# Patient Record
Sex: Male | Born: 1970 | Race: White | Hispanic: No | Marital: Married
Health system: Southern US, Community
[De-identification: ages and names within clinical notes are randomized; demographics above are authoritative.]

---

## 2021-03-26 ENCOUNTER — Ambulatory Visit (INDEPENDENT_AMBULATORY_CARE_PROVIDER_SITE_OTHER): Payer: Managed Care, Other (non HMO)

## 2021-03-26 ENCOUNTER — Other Ambulatory Visit: Payer: Self-pay

## 2021-03-26 ENCOUNTER — Ambulatory Visit (INDEPENDENT_AMBULATORY_CARE_PROVIDER_SITE_OTHER): Payer: Managed Care, Other (non HMO) | Admitting: Family Medicine

## 2021-03-26 VITALS — BP 142/90 | HR 73 | Ht 69.0 in | Wt 239.0 lb

## 2021-03-26 DIAGNOSIS — M9905 Segmental and somatic dysfunction of pelvic region: Secondary | ICD-10-CM | POA: Diagnosis not present

## 2021-03-26 DIAGNOSIS — M9902 Segmental and somatic dysfunction of thoracic region: Secondary | ICD-10-CM

## 2021-03-26 DIAGNOSIS — M25552 Pain in left hip: Secondary | ICD-10-CM

## 2021-03-26 DIAGNOSIS — M9901 Segmental and somatic dysfunction of cervical region: Secondary | ICD-10-CM | POA: Diagnosis not present

## 2021-03-26 DIAGNOSIS — M9903 Segmental and somatic dysfunction of lumbar region: Secondary | ICD-10-CM | POA: Diagnosis not present

## 2021-03-26 DIAGNOSIS — M25551 Pain in right hip: Secondary | ICD-10-CM

## 2021-03-26 DIAGNOSIS — M542 Cervicalgia: Secondary | ICD-10-CM

## 2021-03-26 DIAGNOSIS — M545 Low back pain, unspecified: Secondary | ICD-10-CM

## 2021-03-26 DIAGNOSIS — M9904 Segmental and somatic dysfunction of sacral region: Secondary | ICD-10-CM

## 2021-03-26 DIAGNOSIS — M533 Sacrococcygeal disorders, not elsewhere classified: Secondary | ICD-10-CM | POA: Diagnosis not present

## 2021-03-26 MED ORDER — GABAPENTIN 100 MG PO CAPS: 200.0000 mg | ORAL_CAPSULE | Freq: Every day | ORAL | 0 refills | Status: DC

## 2021-03-26 NOTE — Assessment & Plan Note (Signed)
Seems like patient does have some poor core strength that likely is contributing.  I do believe the patient's right side of the sacrum does seem to have significant swelling noted as well.  Discussed icing regimen and home exercises, which activities to do which wants to avoid.  Increase activity slowly.  Follow-up again in 6 to 8 weeks.

## 2021-03-26 NOTE — Assessment & Plan Note (Signed)
   Decision today to treat with OMT was based on Physical Exam  After verbal consent patient was treated with HVLA, ME, FPR techniques in cervical, thoracic, pelvis, lumbar and sacral areas, all areas are chronic   Patient tolerated the procedure well with improvement in symptoms  Patient given exercises, stretches and lifestyle modifications  See medications in patient instructions if given  Patient will follow up in 4-8 weeks 

## 2021-03-26 NOTE — Patient Instructions (Addendum)
Gabapentin 200mg  at night Ice SI joint exercises Xray today See me again in 4-6 weeks

## 2021-03-26 NOTE — Progress Notes (Signed)
[facility] [address] [place] [address] Phone: [phone] Subjective:   Bruce Donath, am serving as a scribe for Dr. Antoine Primas.  This visit occurred during the SARS-CoV-2 public health emergency.  Safety protocols were in place, including screening questions prior to the visit, additional usage of staff PPE, and extensive cleaning of exam room while observing appropriate contact time as indicated for disinfecting solutions.  I'm seeing this patient by the request  of:  Gordnier, [place], [place]  CC: Neck and back pain follow-up  [id number]  Arthur [name] is a 51 y.o. male coming in with complaint of chronic back and B hip pain. Pain in lower back and radiates into greater trochanter. Denies any radiating symptoms unless he is stretching the muscle. Pain worse in less supportive bed and standing up which will cause a burning. Patient has tried ice, heat, stretching, and chiropractic care. Uses IBU prn.      Social History   Socioeconomic History   Marital status: Married    Spouse name: Not on file   Number of children: Not on file   Years of education: Not on file   Highest education level: Not on file  Occupational History   Not on file  Tobacco Use   Smoking status: Not on file   Smokeless tobacco: Not on file  Substance and Sexual Activity   Alcohol use: Not on file   Drug use: Not on file   Sexual activity: Not on file  Other Topics Concern   Not on file  Social History Narrative   Not on file   Social Determinants of Health   Financial Resource Strain: Not on file  Food Insecurity: Not on file  Transportation Needs: Not on file  Physical Activity: Not on file  Stress: Not on file  Social Connections: Not on file   Not on File No family history on file.   Current Outpatient Medications (Cardiovascular):    amlodipine-atorvastatin (CADUET) 10-10 MG tablet, Take 1 tablet by mouth daily.   hydrochlorothiazide  (MICROZIDE) 12.5 MG capsule, Take 12.5 mg by mouth daily.     Current Outpatient Medications (Other):    gabapentin (NEURONTIN) 100 MG capsule, Take 2 capsules (200 mg total) by mouth at bedtime.   Reviewed prior external information including notes and imaging from  primary care provider As well as notes that were available from care everywhere and other healthcare systems.  Past medical history, social, surgical and family history all reviewed in electronic medical record.  No pertanent information unless stated regarding to the chief complaint.   Review of Systems:  No headache, visual changes, nausea, vomiting, diarrhea, constipation, dizziness, abdominal pain, skin rash, fevers, chills, night sweats, weight loss, swollen lymph nodes, body aches, joint swelling, chest pain, shortness of breath, mood changes. POSITIVE muscle aches  Objective  Blood pressure (!) 142/90, pulse 73, height 5\' 9"  (1.753 m), weight 239 lb (108.4 kg), SpO2 96 %.   General: No apparent distress alert and oriented x3 mood and affect normal, dressed appropriately.  HEENT: Pupils equal, extraocular movements intact  Respiratory: Patient's speak in full sentences and does not appear short of breath  Cardiovascular: No lower extremity edema, non tender, no erythema  Gait normal with good balance and coordination.  MSK:  Non tender with full range of motion and good stability and symmetric strength and tone of shoulders, elbows, wrist, hip, knee and ankles bilaterally.  Low back exam does have some  loss of lordosis.  Some tenderness to palpation of the paraspinal musculature.  Significant tightness of the right side of the hip.  Patient also has what appears to be a pelvic shear.  Neck exam also has significant loss of lordosis.  Does have difficulty with sidebending.  Osteopathic findings C2 flexed rotated and side bent right T9 extended rotated and side bent left L2 flexed rotated and side bent right Sacrum  right on right Pelvic shear right   Impression and Recommendations:    The above documentation has been reviewed and is accurate and complete Judi Saa, DO

## 2021-04-23 NOTE — Progress Notes (Signed)
?Arthur [name] D.O. ?[facility] ?Arthur [name] ?Phone: [phone] ?Subjective:   ?I, Arthur [name], am serving as a scribe for Dr. Hulan Saas. ? ?This visit occurred during the SARS-CoV-2 public health emergency.  Safety protocols were in place, including screening questions prior to the visit, additional usage of staff PPE, and extensive cleaning of exam room while observing appropriate contact time as indicated for disinfecting solutions.  ? ? ?I'm seeing this patient by the request  of:  Arthur [name], [place] ? ?CC: Low back pain follow-up ? ?[id number]  ?Arthur [name] is a 51 y.o. male coming in with complaint of back and neck pain. OMT on 03/26/2021.  Patient was seen and started on gabapentin last exam given home exercises.  Patient states that lumbar spine and L hip are bothering him as he missed his chiropractic appt last week. Pain is radiating down lateral aspect of thighs. Pain is intermittent.  ? 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 ?X-rays of the hips also showed some mild degenerative changes bilaterally. ? 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 ? ? ?Medications patient has been prescribed:  ? ?Taking: ? ? ?  ? ? ? ? ?Reviewed prior external information including notes and imaging from previsou exam, outside providers and external EMR if available.  ? ?As well as notes that were available from care everywhere and other healthcare systems. ? ?Past medical history, social, surgical and family history all reviewed in electronic medical record.  No pertanent information unless stated regarding to the chief complaint.  ? ?No past medical history on file.  ?Not on File ? ? ?Review of Systems: ? No headache, visual changes, nausea, vomiting, diarrhea, constipation, dizziness, abdominal  pain, skin rash, fevers, chills, night sweats, weight loss, swollen lymph nodes, body aches, joint swelling, chest pain, shortness of breath, mood changes. POSITIVE muscle aches ? ?Objective  ?Blood pressure 110/78, pulse 78, height 5\' 9"  (1.753 m), weight 239 lb (108.4 kg), SpO2 98 %. ?  ?General: No apparent distress alert and oriented x3 mood and affect normal, dressed appropriately.  ?HEENT: Pupils equal, extraocular movements intact  ?Respiratory: Patient's speak in full sentences and does not appear short of breath  ?Cardiovascular: No lower extremity edema, non tender, no erythema  ?Gait normal with good balance and coordination.  ?Back - lower back does have loss of lordosis, does have tightness noted with FABER test bilaterally.  Patient still has significant tightness with straight leg test but no true radicular symptoms.  Patient does have some tightness noted overall. ? ?Osteopathic findings ? ?C6 flexed rotated and side bent left ?T7 extended rotated and side bent right  ?L1 flexed rotated and side bent right ?L4 flexed rotated and side bent left ?Sacrum right on right ? ? ? ? ?  ?Assessment and Plan: ? ? ?SI (sacroiliac) joint dysfunction ?Chronic problem, with mild exacerbation.  Patient continues to have some discomfort and pain.  Patient does have some arthritic changes that do seem to be a little bit out of proportion to patient's age at this point and I do feel that further laboratory work-up would be beneficial.  Patient will have this done and we will see how patient responds.  Discussed icing regimen and home exercises.  Increase activity slowly. F/u 6- 8 weeks ?  ?  Nonallopathic problems ? ?Decision today to treat with OMT was based on Physical Exam ? ?After verbal consent patient was treated with HVLA, ME, FPR techniques in cervical,  thoracic, lumbar, and sacral  areas ? ?Patient tolerated the procedure well with improvement in symptoms ? ?Patient given exercises, stretches and lifestyle  modifications ? ?See medications in patient instructions if given ? ?Patient will follow up in 4-8 weeks ? ?  ? ? ?The above documentation has been reviewed and is accurate and complete Arthur Pulley, DO ? ? ? ?  ? ? Note: This dictation was prepared with Dragon dictation along with smaller phrase technology. Any transcriptional errors that result from this process are unintentional.    ?  ?  ? ?

## 2021-04-24 ENCOUNTER — Encounter: Payer: Self-pay | Admitting: Family Medicine

## 2021-04-24 ENCOUNTER — Ambulatory Visit (INDEPENDENT_AMBULATORY_CARE_PROVIDER_SITE_OTHER): Payer: Managed Care, Other (non HMO) | Admitting: Family Medicine

## 2021-04-24 ENCOUNTER — Other Ambulatory Visit: Payer: Self-pay

## 2021-04-24 VITALS — BP 110/78 | HR 78 | Ht 69.0 in | Wt 239.0 lb

## 2021-04-24 DIAGNOSIS — M9904 Segmental and somatic dysfunction of sacral region: Secondary | ICD-10-CM | POA: Diagnosis not present

## 2021-04-24 DIAGNOSIS — M2550 Pain in unspecified joint: Secondary | ICD-10-CM | POA: Diagnosis not present

## 2021-04-24 DIAGNOSIS — M9902 Segmental and somatic dysfunction of thoracic region: Secondary | ICD-10-CM | POA: Diagnosis not present

## 2021-04-24 DIAGNOSIS — M9903 Segmental and somatic dysfunction of lumbar region: Secondary | ICD-10-CM | POA: Diagnosis not present

## 2021-04-24 DIAGNOSIS — M533 Sacrococcygeal disorders, not elsewhere classified: Secondary | ICD-10-CM

## 2021-04-24 LAB — COMPREHENSIVE METABOLIC PANEL
ALT: 24 U/L (ref 0–53)
AST: 18 U/L (ref 0–37)
Albumin: 4.6 g/dL (ref 3.5–5.2)
Alkaline Phosphatase: 66 U/L (ref 39–117)
BUN: 14 mg/dL (ref 6–23)
CO2: 30 mEq/L (ref 19–32)
Calcium: 9.4 mg/dL (ref 8.4–10.5)
Chloride: 101 mEq/L (ref 96–112)
Creatinine, Ser: 1.09 mg/dL (ref 0.40–1.50)
GFR: 79.85 mL/min (ref >60.00)
Glucose, Bld: 84 mg/dL (ref 70–99)
Potassium: 3.9 mEq/L (ref 3.5–5.1)
Sodium: 139 mEq/L (ref 135–145)
Total Bilirubin: 1.1 mg/dL (ref 0.2–1.2)
Total Protein: 7 g/dL (ref 6.0–8.3)

## 2021-04-24 LAB — URIC ACID: Uric Acid, Serum: 7.2 mg/dL (ref 4.0–7.8)

## 2021-04-24 LAB — CBC WITH DIFFERENTIAL/PLATELET
Basophils Absolute: 0 10*3/uL (ref 0.0–0.1)
Basophils Relative: 0.5 % (ref 0.0–3.0)
Eosinophils Absolute: 0.3 10*3/uL (ref 0.0–0.7)
Eosinophils Relative: 3.2 % (ref 0.0–5.0)
HCT: 44.5 % (ref 39.0–52.0)
Hemoglobin: 15.5 g/dL (ref 13.0–17.0)
Lymphocytes Relative: 27.3 % (ref 12.0–46.0)
Lymphs Abs: 2.5 10*3/uL (ref 0.7–4.0)
MCHC: 34.9 g/dL (ref 30.0–36.0)
MCV: 85.6 fl (ref 78.0–100.0)
Monocytes Absolute: 0.9 10*3/uL (ref 0.1–1.0)
Monocytes Relative: 9.7 % (ref 3.0–12.0)
Neutro Abs: 5.5 10*3/uL (ref 1.4–7.7)
Neutrophils Relative %: 59.3 % (ref 43.0–77.0)
Platelets: 376 10*3/uL (ref 150.0–400.0)
RBC: 5.2 Mil/uL (ref 4.22–5.81)
RDW: 13.5 % (ref 11.5–15.5)
WBC: 9.2 10*3/uL (ref 4.0–10.5)

## 2021-04-24 LAB — FERRITIN: Ferritin: 182 ng/mL (ref 22.0–322.0)

## 2021-04-24 LAB — VITAMIN D 25 HYDROXY (VIT D DEFICIENCY, FRACTURES): VITD: 22.31 ng/mL — ABNORMAL LOW (ref 30.00–100.00)

## 2021-04-24 LAB — IBC PANEL
Iron: 128 ug/dL (ref 42–165)
Saturation Ratios: 37.6 % (ref 20.0–50.0)
TIBC: 340.2 ug/dL (ref 250.0–450.0)
Transferrin: 243 mg/dL (ref 212.0–360.0)

## 2021-04-24 LAB — C-REACTIVE PROTEIN: CRP: <1 mg/dL (ref 0.5–20.0)

## 2021-04-24 LAB — VITAMIN B12: Vitamin B-12: 418 pg/mL (ref 211–911)

## 2021-04-24 LAB — TSH: TSH: 2.53 u[IU]/mL (ref 0.35–5.50)

## 2021-04-24 LAB — SEDIMENTATION RATE: Sed Rate: 3 mm/hr (ref 0–15)

## 2021-04-24 LAB — TESTOSTERONE: Testosterone: 211.84 ng/dL — ABNORMAL LOW (ref 300.00–890.00)

## 2021-04-24 NOTE — Assessment & Plan Note (Signed)
Chronic problem, with mild exacerbation.  Patient continues to have some discomfort and pain.  Patient does have some arthritic changes that do seem to be a little bit out of proportion to patient's age at this point and I do feel that further laboratory work-up would be beneficial.  Patient will have this done and we will see how patient responds.  Discussed icing regimen and home exercises.  Increase activity slowly. F/u 6- 8 weeks ?

## 2021-04-24 NOTE — Patient Instructions (Signed)
Labs today See me in 5-6 weeks  

## 2021-04-25 ENCOUNTER — Other Ambulatory Visit: Payer: Self-pay

## 2021-04-25 MED ORDER — VITAMIN D (ERGOCALCIFEROL) 1.25 MG (50000 UNIT) PO CAPS: 50000.0000 [IU] | ORAL_CAPSULE | ORAL | 0 refills | Status: DC

## 2021-04-27 LAB — ANGIOTENSIN CONVERTING ENZYME: Angiotensin-Converting Enzyme: 9 U/L (ref 9–67)

## 2021-04-27 LAB — ANA: Anti Nuclear Antibody (ANA): NEGATIVE

## 2021-04-27 LAB — PTH, INTACT AND CALCIUM
Calcium: 9.4 mg/dL (ref 8.6–10.3)
PTH: 46 pg/mL (ref 16–77)

## 2021-04-27 LAB — CALCIUM, IONIZED: Calcium, Ion: 5.1 mg/dL (ref 4.7–5.5)

## 2021-04-27 LAB — CYCLIC CITRUL PEPTIDE ANTIBODY, IGG: Cyclic Citrullin Peptide Ab: <16 UNITS

## 2021-04-27 LAB — RHEUMATOID FACTOR: Rheumatoid fact SerPl-aCnc: <14 IU/mL (ref <14)

## 2021-05-28 NOTE — Progress Notes (Signed)
?Terrilee Files D.O. ?[facility] ?[address] [place] [address] ?Phone: [phone] ?Subjective:   ?I, Nadine Counts, am serving as a Neurosurgeon for Dr. Antoine Primas. ?This visit occurred during the SARS-CoV-2 public health emergency.  Safety protocols were in place, including screening questions prior to the visit, additional usage of staff PPE, and extensive cleaning of exam room while observing appropriate contact time as indicated for disinfecting solutions.  ? ?I'm seeing this patient by the request  of:  Daylene Katayama, [place] ? ?CC: back and neck pain  ? ?[id number]  ?Arthur [name] is a 51 y.o. male coming in with complaint of back and neck pain. OMT on 04/24/2021. Patient states doing a little bit better. Right arm believes he tore something in 2019. Feels weak carrying loads. No other complaint. ? ?Medications patient has been prescribed: Gabapentin Vit D ? ?Taking: ? ? ?  ? ? ? ? ?Reviewed prior external information including notes and imaging from previsou exam, outside providers and external EMR if available.  ? ?As well as notes that were available from care everywhere and other healthcare systems. ? ?Past medical history, social, surgical and family history all reviewed in electronic medical record.  No pertanent information unless stated regarding to the chief complaint.  ? ?No past medical history on file.  ?Not on File ? ? ?Review of Systems: ? No headache, visual changes, nausea, vomiting, diarrhea, constipation, dizziness, abdominal pain, skin rash, fevers, chills, night sweats, weight loss, swollen lymph nodes, body aches, joint swelling, chest pain, shortness of breath, mood changes. POSITIVE muscle aches ? ?Objective  ?Blood pressure (!) 142/90, pulse 72, height 5\' 9"  (1.753 m), weight 220 lb (99.8 kg), SpO2 99 %. ?  ?General: No apparent distress alert and oriented x3 mood and affect normal, dressed appropriately.  ?HEENT: Pupils equal, extraocular movements intact   ?Respiratory: Patient's speak in full sentences and does not appear short of breath  ?Cardiovascular: No lower extremity edema, non tender, no erythema  ?Back pain noted more around the sacroiliac joints bilaterally.  Still has tightness noted.  No tenderness to palpation.  Tightness of the hip flexors noted. ? ?\Right arm exam shows the patient does have some tenderness over the brachial radialis.  No masses noted.  Patient has no pain with isolated bicep flexion. ? ?Osteopathic findings ? ?C2 flexed rotated and side bent right ?C6 flexed rotated and side bent left ?T3 extended rotated and side bent right inhaled rib ?T9 extended rotated and side bent left ?L3 flexed rotated and side bent right ?Sacrum right on right ? ? ? ? ?  ?Assessment and Plan: ? ?SI (sacroiliac) joint dysfunction ?Continues to have some tightness noted.  Discussed icing regimen and home exercises.  Still discussed that patient does need to stretch at the end of this.  Patient should do relatively well with conservative therapy still.  Follow-up again in 5 to 6 weeks and hopefully will be able to have patient progress thereafter.  ? ?Nonallopathic problems ? ?Decision today to treat with OMT was based on Physical Exam ? ?After verbal consent patient was treated with HVLA, ME, FPR techniques in  thoracic, lumbar, and sacral  areas ? ?Patient tolerated the procedure well with improvement in symptoms ? ?Patient given exercises, stretches and lifestyle modifications ? ?See medications in patient instructions if given ? ?Patient will follow up in 4-8 weeks ? ?  ? ? ?The above documentation has been reviewed and is accurate and complete  Katrinka Blazing, DO ? ? ? ?  ? ? Note: This dictation was prepared with Dragon dictation along with smaller phrase technology. Any transcriptional errors that result from this process are unintentional.    ?  ?  ? ?

## 2021-05-29 ENCOUNTER — Ambulatory Visit (INDEPENDENT_AMBULATORY_CARE_PROVIDER_SITE_OTHER): Payer: Managed Care, Other (non HMO) | Admitting: Family Medicine

## 2021-05-29 VITALS — BP 142/90 | HR 72 | Ht 69.0 in | Wt 220.0 lb

## 2021-05-29 DIAGNOSIS — M9904 Segmental and somatic dysfunction of sacral region: Secondary | ICD-10-CM

## 2021-05-29 DIAGNOSIS — M9902 Segmental and somatic dysfunction of thoracic region: Secondary | ICD-10-CM

## 2021-05-29 DIAGNOSIS — M533 Sacrococcygeal disorders, not elsewhere classified: Secondary | ICD-10-CM | POA: Diagnosis not present

## 2021-05-29 DIAGNOSIS — M9903 Segmental and somatic dysfunction of lumbar region: Secondary | ICD-10-CM

## 2021-05-29 DIAGNOSIS — M7918 Myalgia, other site: Secondary | ICD-10-CM | POA: Diagnosis not present

## 2021-05-29 NOTE — Assessment & Plan Note (Signed)
Discussed compression and icing regimen.  Increase activity slowly.  Follow-up again in 6 to 8 weeks.  Worsening pain consider formal physical therapy ?

## 2021-05-29 NOTE — Assessment & Plan Note (Signed)
Continues to have some tightness noted.  Discussed icing regimen and home exercises.  Still discussed that patient does need to stretch at the end of this.  Patient should do relatively well with conservative therapy still.  Follow-up again in 5 to 6 weeks and hopefully will be able to have patient progress thereafter. ?

## 2021-05-29 NOTE — Patient Instructions (Signed)
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

## 2021-07-02 NOTE — Progress Notes (Unsigned)
  [place] Phone: [phone] Subjective:   Arthur [name], am serving as a scribe for Dr. Hulan Saas.   I'm seeing this patient by the request  of:  Arthur [name], Arthur [name], [place]  CC: back and neck pain   [id number]  Arthur [name] is a 51 y.o. male coming in with complaint of back and neck pain. OMT on 4/26/203. Patient states that he has been doing a lot of work and had to go see his chiropractor due to increase in pain. Pain has subsided. Using gabapentin.    Medications patient has been prescribed: Gabapentin Vit D  Taking:         Reviewed prior external information including notes and imaging from previsou exam, outside providers and external EMR if available.   As well as notes that were available from care everywhere and other healthcare systems.  Past medical history, social, surgical and family history all reviewed in electronic medical record.  [name] pertanent information unless stated regarding to the chief complaint.   [name] past medical history on file.  Not on File   Review of Systems:  [name] headache, visual changes, nausea, vomiting, diarrhea, constipation, dizziness, abdominal pain, skin rash, fevers, chills, night sweats, weight loss, swollen lymph nodes, body aches, joint swelling, chest pain, shortness of breath, mood changes. POSITIVE muscle aches  Objective  Blood pressure 124/84, pulse 69, height 5\' 9"  (1.753 m), weight 216 lb (98 kg), SpO2 98 %.   General: [name] apparent distress alert and oriented x3 mood and affect normal, dressed appropriately.  HEENT: Pupils equal, extraocular movements intact  Respiratory: Patient's speak in full sentences and does not appear short of breath  Cardiovascular: [name] lower extremity edema, non tender, [name] erythema  Low back exam does have more tightness noted.  Seems to be around the sacroiliac joint.  Patient does have some limited sidebending  bilaterally.  4-5 strength of the hip abductors but seems to be symmetric to the contralateral side.   Osteopathic findings  T6 extended rotated and side bent left L2 flexed rotated and side bent right Sacrum right on right       Assessment and Plan:  SI (sacroiliac) joint dysfunction Chronic, with exacerbation.  Discussed icing regimen and home exercises, discussed which activities to do and which ones to avoid, increase activity slowly.  We discussed hip abductor strengthening.  Continue the gabapentin.  May need to consider anti-inflammatories depending on how patient responds.  Follow-up again in 6 to 8 weeks otherwise.    Nonallopathic problems  Decision today to treat with OMT was based on Physical Exam  After verbal consent patient was treated with HVLA, ME, FPR techniques in , thoracic, lumbar, and sacral  areas  Patient tolerated the procedure well with improvement in symptoms  Patient given exercises, stretches and lifestyle modifications  See medications in patient instructions if given  Patient will follow up in 4-8 weeks     The above documentation has been reviewed and is accurate and complete Arthur Pulley, DO        Note: This dictation was prepared with Dragon dictation along with smaller phrase technology. Any transcriptional errors that result from this process are unintentional.

## 2021-07-03 ENCOUNTER — Ambulatory Visit (INDEPENDENT_AMBULATORY_CARE_PROVIDER_SITE_OTHER): Payer: Managed Care, Other (non HMO) | Admitting: Family Medicine

## 2021-07-03 VITALS — BP 124/84 | HR 69 | Ht 69.0 in | Wt 216.0 lb

## 2021-07-03 DIAGNOSIS — M9902 Segmental and somatic dysfunction of thoracic region: Secondary | ICD-10-CM

## 2021-07-03 DIAGNOSIS — M533 Sacrococcygeal disorders, not elsewhere classified: Secondary | ICD-10-CM

## 2021-07-03 DIAGNOSIS — M9903 Segmental and somatic dysfunction of lumbar region: Secondary | ICD-10-CM

## 2021-07-03 DIAGNOSIS — M9904 Segmental and somatic dysfunction of sacral region: Secondary | ICD-10-CM | POA: Diagnosis not present

## 2021-07-03 NOTE — Assessment & Plan Note (Signed)
Chronic, with exacerbation.  Discussed icing regimen and home exercises, discussed which activities to do and which ones to avoid, increase activity slowly.  We discussed hip abductor strengthening.  Continue the gabapentin.  May need to consider anti-inflammatories depending on how patient responds.  Follow-up again in 6 to 8 weeks otherwise.

## 2021-07-03 NOTE — Patient Instructions (Signed)
Do stretch when you can See me in 4 weeks

## 2021-07-31 NOTE — Progress Notes (Unsigned)
[facility] [address] [place] [address] Phone: [phone] Subjective:   [name], am serving as a scribe for Dr. Antoine Primas.  I'm seeing this patient by the request  of:  Gordnier, [place], PA  CC: Low back pain follow-up  [id number]  Arthur [name] is a 51 y.o. male coming in with complaint of back and neck pain. OMT 5/32/2023. Patient states about the same. No new complaints just more tightness again.  Last week seem to be getting tighter than what it was previously.  Does feel like he does get response though when patient does have manipulation.  Medications patient has been prescribed: Vit D, Gabapentin  Taking:         Reviewed prior external information including notes and imaging from previsou exam, outside providers and external EMR if available.   As well as notes that were available from care everywhere and other healthcare systems.  Past medical history, social, surgical and family history all reviewed in electronic medical record.  No pertanent information unless stated regarding to the chief complaint.   No past medical history on file.  Not on File   Review of Systems:  No headache, visual changes, nausea, vomiting, diarrhea, constipation, dizziness, abdominal pain, skin rash, fevers, chills, night sweats, weight loss, swollen lymph nodes, body aches, joint swelling, chest pain, shortness of breath, mood changes. POSITIVE muscle aches  Objective  Blood pressure 130/90, pulse 62, height 5\' 9"  (1.753 m), weight 217 lb (98.4 kg), SpO2 97 %.   General: No apparent distress alert and oriented x3 mood and affect normal, dressed appropriately.  HEENT: Pupils equal, extraocular movements intact  Respiratory: Patient's speak in full sentences and does not appear short of breath  Cardiovascular: No lower extremity edema, non tender, no erythema  Gait MSK:  Back does have some mild loss of lordosis.  Some  tenderness to palpation in the paraspinal musculature.  Patient does have tightness around the sacroiliac joints and seems to be more bilateral.  Positive FABER test.  Negative straight leg test.  Osteopathic findings  T5 extended rotated and side bent left L2 flexed rotated and side bent right L5 flexed rotated and side bent left Sacrum right on right       Assessment and Plan:  SI (sacroiliac) joint dysfunction Continues to have sacroiliac pain.  Patient does have the gabapentin.  Encouraged him to continue to take it on a regular basis.  The patient was doing much better until this last week when the chronic issues seem to progress or worsening again.  Discussed which activities to do and which ones to avoid otherwise.  No change in patient's.  Follow-up again in 6 to 8 weeks    Nonallopathic problems  Decision today to treat with OMT was based on Physical Exam  After verbal consent patient was treated with HVLA, ME, FPR techniques in cervical, rib, thoracic, lumbar, and sacral  areas  Patient tolerated the procedure well with improvement in symptoms  Patient given exercises, stretches and lifestyle modifications  See medications in patient instructions if given  Patient will follow up in 4-8 weeks    The above documentation has been reviewed and is accurate and complete , DO          Note: This dictation was prepared with Dragon dictation along with smaller phrase technology. Any transcriptional errors that result from this process are unintentional.

## 2021-08-01 ENCOUNTER — Ambulatory Visit (INDEPENDENT_AMBULATORY_CARE_PROVIDER_SITE_OTHER): Payer: Managed Care, Other (non HMO) | Admitting: Family Medicine

## 2021-08-01 VITALS — BP 130/90 | HR 62 | Ht 69.0 in | Wt 217.0 lb

## 2021-08-01 DIAGNOSIS — M533 Sacrococcygeal disorders, not elsewhere classified: Secondary | ICD-10-CM

## 2021-08-01 DIAGNOSIS — M9903 Segmental and somatic dysfunction of lumbar region: Secondary | ICD-10-CM | POA: Diagnosis not present

## 2021-08-01 DIAGNOSIS — M9904 Segmental and somatic dysfunction of sacral region: Secondary | ICD-10-CM | POA: Diagnosis not present

## 2021-08-01 DIAGNOSIS — M9902 Segmental and somatic dysfunction of thoracic region: Secondary | ICD-10-CM | POA: Diagnosis not present

## 2021-08-01 MED ORDER — VITAMIN D (ERGOCALCIFEROL) 1.25 MG (50000 UNIT) PO CAPS: 50000.0000 [IU] | ORAL_CAPSULE | ORAL | 0 refills | Status: DC

## 2021-08-01 NOTE — Patient Instructions (Addendum)
Good to see you! Vit D refilled Good luck with drive to [place] See you again in 7-8 weeks

## 2021-08-01 NOTE — Assessment & Plan Note (Signed)
Continues to have sacroiliac pain.  Patient does have the gabapentin.  Encouraged him to continue to take it on a regular basis.  The patient was doing much better until this last week when the chronic issues seem to progress or worsening again.  Discussed which activities to do and which ones to avoid otherwise.  No change in patient's.  Follow-up again in 6 to 8 weeks

## 2021-09-18 NOTE — Progress Notes (Unsigned)
  [name] [address] [place] [address] Phone: [phone] Subjective:   Arthur [name], am serving as a scribe for Dr. Antoine Primas.  I'm seeing this patient by the request  of:  Gordnier, [place], [place]  CC: back pain follow up   [id number]  Arthur [name] is a 51 y.o. male coming in with complaint of back and neck pain. OMT on 08/01/2021. Patient states doing well. Same per usual. No new issues  Medications patient has been prescribed: Vit D  Taking:       Review of Systems:  No headache, visual changes, nausea, vomiting, diarrhea, constipation, dizziness, abdominal pain, skin rash, fevers, chills, night sweats, weight loss, swollen lymph nodes, body aches, joint swelling, chest pain, shortness of breath, mood changes. POSITIVE muscle aches  Objective  Blood pressure (!) 130/96, pulse 70, height 5\' 9"  (1.753 m), weight 218 lb (98.9 kg), SpO2 98 %.   General: No apparent distress alert and oriented x3 mood and affect normal, dressed appropriately.  HEENT: Pupils equal, extraocular movements intact  Respiratory: Patient's speak in full sentences and does not appear short of breath  Cardiovascular: No lower extremity edema, non tender, no erythema  Gait MSK:  Back low back exam does have some loss of lordosis.  Some tenderness to palpation in the paraspinal musculature.  Patient does have some hip abductor tightness noted right greater than left.  Osteopathic findings   T7 extended rotated and side bent left L2 flexed rotated and side bent right Sacrum right on right       Assessment and Plan:  SI (sacroiliac) joint dysfunction Continues to have tightness on the right side of the low back in the sacroiliac area.  Patient continues to be active or possible.  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  Follow-up with me again in 6 to 8 weeks.     Nonallopathic problems  Decision today to treat with OMT was based on Physical Exam  After verbal consent patient was treated with HVLA, ME, FPR techniques in  thoracic, lumbar, and sacral  areas  Patient tolerated the procedure well with improvement in symptoms  Patient given exercises, stretches and lifestyle modifications  See medications in patient instructions if given  Patient will follow up in 4-8 weeks    The above documentation has been reviewed and is accurate and complete , DO          Note: This dictation was prepared with Dragon dictation along with smaller phrase technology. Any transcriptional errors that result from this process are unintentional.

## 2021-09-19 ENCOUNTER — Ambulatory Visit (INDEPENDENT_AMBULATORY_CARE_PROVIDER_SITE_OTHER): Payer: Managed Care, Other (non HMO) | Admitting: Family Medicine

## 2021-09-19 VITALS — BP 130/96 | HR 70 | Ht 69.0 in | Wt 218.0 lb

## 2021-09-19 DIAGNOSIS — M9904 Segmental and somatic dysfunction of sacral region: Secondary | ICD-10-CM

## 2021-09-19 DIAGNOSIS — M9903 Segmental and somatic dysfunction of lumbar region: Secondary | ICD-10-CM

## 2021-09-19 DIAGNOSIS — M9902 Segmental and somatic dysfunction of thoracic region: Secondary | ICD-10-CM

## 2021-09-19 DIAGNOSIS — M533 Sacrococcygeal disorders, not elsewhere classified: Secondary | ICD-10-CM

## 2021-09-19 NOTE — Patient Instructions (Signed)
Good luck with building project Try to stretch after a lot of activity

## 2021-09-19 NOTE — Assessment & Plan Note (Signed)
Continues to have tightness on the right side of the low back in the sacroiliac area.  Patient continues to be active or possible.  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  Follow-up with me again in 6 to 8 weeks.

## 2021-11-14 NOTE — Progress Notes (Deleted)
  [place] Phone: [phone] Subjective:    I'm seeing this patient by the request  of:  Virginia [place], [place]  CC:   [id number]  Arthur [name] is a 51 y.o. male coming in with complaint of back and neck pain. OMT on 09/19/2021. Patient states   Medications patient has been prescribed: None  Taking:         Reviewed prior external information including notes and imaging from previsou exam, outside providers and external EMR if available.   As well as notes that were available from care everywhere and other healthcare systems.  Past medical history, social, surgical and family history all reviewed in electronic medical record.  No pertanent information unless stated regarding to the chief complaint.   No past medical history on file.  Not on File   Review of Systems:  No headache, visual changes, nausea, vomiting, diarrhea, constipation, dizziness, abdominal pain, skin rash, fevers, chills, night sweats, weight loss, swollen lymph nodes, body aches, joint swelling, chest pain, shortness of breath, mood changes. POSITIVE muscle aches  Objective  There were no vitals taken for this visit.   General: No apparent distress alert and oriented x3 mood and affect normal, dressed appropriately.  HEENT: Pupils equal, extraocular movements intact  Respiratory: Patient's speak in full sentences and does not appear short of breath  Cardiovascular: No lower extremity edema, non tender, no erythema  Gait MSK:  Back   Osteopathic findings  C2 flexed rotated and side bent right C6 flexed rotated and side bent left T3 extended rotated and side bent right inhaled rib T9 extended rotated and side bent left L2 flexed rotated and side bent right Sacrum right on right       Assessment and Plan:  No problem-specific Assessment & Plan notes found for this encounter.    Nonallopathic  problems  Decision today to treat with OMT was based on Physical Exam  After verbal consent patient was treated with HVLA, ME, FPR techniques in cervical, rib, thoracic, lumbar, and sacral  areas  Patient tolerated the procedure well with improvement in symptoms  Patient given exercises, stretches and lifestyle modifications  See medications in patient instructions if given  Patient will follow up in 4-8 weeks             Note: This dictation was prepared with Dragon dictation along with smaller phrase technology. Any transcriptional errors that result from this process are unintentional.

## 2021-11-19 ENCOUNTER — Ambulatory Visit (INDEPENDENT_AMBULATORY_CARE_PROVIDER_SITE_OTHER): Payer: Managed Care, Other (non HMO) | Admitting: Family Medicine

## 2021-11-19 VITALS — BP 124/86 | HR 77 | Ht 69.0 in | Wt 222.0 lb

## 2021-11-19 DIAGNOSIS — M9904 Segmental and somatic dysfunction of sacral region: Secondary | ICD-10-CM | POA: Diagnosis not present

## 2021-11-19 DIAGNOSIS — M533 Sacrococcygeal disorders, not elsewhere classified: Secondary | ICD-10-CM | POA: Diagnosis not present

## 2021-11-19 DIAGNOSIS — M9902 Segmental and somatic dysfunction of thoracic region: Secondary | ICD-10-CM

## 2021-11-19 DIAGNOSIS — M9908 Segmental and somatic dysfunction of rib cage: Secondary | ICD-10-CM

## 2021-11-19 DIAGNOSIS — M9903 Segmental and somatic dysfunction of lumbar region: Secondary | ICD-10-CM | POA: Diagnosis not present

## 2021-11-19 DIAGNOSIS — M9901 Segmental and somatic dysfunction of cervical region: Secondary | ICD-10-CM

## 2021-11-19 NOTE — Patient Instructions (Signed)
Good to see you! I think we are doing okay, but we can always do an MRI

## 2021-11-19 NOTE — Assessment & Plan Note (Signed)
Sacroiliac joint dysfunction.  We discussed the possibility of other epidural or steroid injections.  Would need to consider the possibility of advanced imaging if this continues to worsen.  Is a chronic problem with exacerbation.  Discussed the gabapentin.  Discussed over-the-counter anti-inflammatories as well.  Follow-up again in 6 to 8 weeks.

## 2021-11-19 NOTE — Progress Notes (Signed)
  [place] Phone: [phone] Subjective:    I'm seeing this patient by the request  of:  Gordnier, [place], PA  CC: Back and neck pain follow-up  [id number]  Arthur [name] is a 51 y.o. male coming in with complaint of back and neck pain. OMT on 09/19/2021. Patient states here for manipulation. Wants to know about other options for hip and back pain. Possibly injections.  Medications patient has been prescribed:   Taking:         Reviewed prior external information including notes and imaging from previsou exam, outside providers and external EMR if available.   As well as notes that were available from care everywhere and other healthcare systems.  Past medical history, social, surgical and family history all reviewed in electronic medical record.  No pertanent information unless stated regarding to the chief complaint.   No past medical history on file.  Not on File   Review of Systems:  No headache, visual changes, nausea, vomiting, diarrhea, constipation, dizziness, abdominal pain, skin rash, fevers, chills, night sweats, weight loss, swollen lymph nodes, body aches, joint swelling, chest pain, shortness of breath, mood changes. POSITIVE muscle aches  Objective  Blood pressure 124/86, pulse 77, height 5\' 9"  (1.753 m), weight 222 lb (100.7 kg), SpO2 98 %.   General: No apparent distress alert and oriented x3 mood and affect normal, dressed appropriately.  HEENT: Pupils equal, extraocular movements intact  Respiratory: Patient's speak in full sentences and does not appear short of breath  Cardiovascular: No lower extremity edema, non tender, no erythema  Low back exam does have some loss of lordosis.  Some tenderness to palpation noted. Tenderness in the paraspinal musculature.  Seems to be more over the sacroiliac joint right greater than left.  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  We discussed the possibility of other epidural or steroid injections.  Would need to consider the possibility of advanced imaging if this continues to worsen.  Is a chronic problem with exacerbation.  Discussed the gabapentin.  Discussed over-the-counter anti-inflammatories as well.  Follow-up again in 6 to 8 weeks.    Nonallopathic problems  Decision today to treat with OMT was based on Physical Exam  After verbal consent patient was treated with HVLA, ME, FPR techniques in cervical, rib, thoracic, lumbar, and sacral  areas  Patient tolerated the procedure well with improvement in symptoms  Patient given exercises, stretches and lifestyle modifications  See medications in patient instructions if given  Patient will follow up in 4-8 weeks     The above documentation has been reviewed and is accurate and complete Lyndal Pulley, DO         Note: This dictation was prepared with Dragon dictation along with smaller phrase technology. Any transcriptional errors that result from this process are unintentional.

## 2021-11-20 ENCOUNTER — Ambulatory Visit: Payer: Managed Care, Other (non HMO) | Admitting: Family Medicine

## 2021-12-25 ENCOUNTER — Telehealth: Payer: Self-pay | Admitting: Family Medicine

## 2021-12-25 ENCOUNTER — Other Ambulatory Visit: Payer: Self-pay

## 2021-12-25 DIAGNOSIS — M545 Low back pain, unspecified: Secondary | ICD-10-CM

## 2021-12-25 NOTE — Telephone Encounter (Signed)
Pt called, as per conversation at last visit, pt is requesting we order an MRI. He is hopeful he could get it done by year end.  I attempted to ask what part of the body we would be imaging, he mentioned is R shoulder/Bicep and back. The last OV notes mention SI joint. I could not determine further from our conversation.

## 2021-12-25 NOTE — Telephone Encounter (Signed)
MRI ordered. Called patient to clarify what area is bothering him to make sure he is not wanting images of other areas.

## 2022-01-03 NOTE — Progress Notes (Unsigned)
[facility] [address] [place] [address] Phone: [phone] Subjective:    I'm seeing this patient by the request  of:  Gordnier, [place], PA  CC: back and neck pain follow up   [id number]  Arthur [name] is a 51 y.o. male coming in with complaint of back and neck pain. OMT 11/19/2021.  Patient states continuing to have discomfort but has a new problem.  Worsening right shoulder pain.  States that we have not discussed this before but now affecting many more things.  Has noticed more weakness.  Affecting daily activities such as dressing as well as sleep.  Affecting job performance.  Patient states that it is severe overall.  Patient states that throughout the day seems to be getting worse and unfortunately sometimes stops him from doing his work even.  Initially anti-inflammatories helped but does not seem to be helpful anymore.  Patient states that he has tried some physical therapy previously but no longer having benefit from it.  Medications patient has been prescribed: Vit D  Taking:         Reviewed prior external information including notes and imaging from previsou exam, outside providers and external EMR if available.   As well as notes that were available from care everywhere and other healthcare systems.  Past medical history, social, surgical and family history all reviewed in electronic medical record.  No pertanent information unless stated regarding to the chief complaint.   No past medical history on file.  Not on File   Review of Systems:  No headache, visual changes, nausea, vomiting, diarrhea, constipation, dizziness, abdominal pain, skin rash, fevers, chills, night sweats, weight loss, swollen lymph nodes, body aches, joint swelling, chest pain, shortness of breath, mood changes. POSITIVE muscle aches  Objective  Weight 228 lb (103.4 kg).   General: No apparent distress alert and oriented x3 mood and affect  normal, dressed appropriately.  HEENT: Pupils equal, extraocular movements intact  Respiratory: Patient's speak in full sentences and does not appear short of breath  Cardiovascular: No lower extremity edema, non tender, no erythema  Shoulder: Right Inspection does not show any true atrophy.  Patient though does have weakness with 3 out of 5 strength of the rotator cuff.  Contralateral shoulder has 5 out of 5 strength.  Patient is tender to palpation diffusely.  Positive impingement with Neer and Hawkins.  Positive empty can.  MSK US performed of: Right This study was ordered, performed, and interpreted by Terrilee Files D.O.  Shoulder:   Supraspinatus: Patient does have what appears to be a high-grade tear noted of the supraspinatus.  Does have some calcific changes noted as well.  Possible mild retraction noted of the deep fibers. Glenohumeral Joint:  Appears normal without effusion. Biceps Tendon: Mild hypoechoic changes noted.  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  This is affecting his daily activities, his work performance, as well as his nighttime.  I am concerned for an acute rotator cuff tear with the weakness noted as well.  Patient's ultrasound does show what appears to be likely a partial high-grade tear of the supraspinatus.  At this point because this is affecting all daily activities and even things such as dressing and sleep we do need to get the advanced imaging and depending on findings see if surgical intervention would  be necessary or if patient would do better with conservative therapy.  We discussed different medications but he would like to continue with the over-the-counter anti-inflammatories but will increase his gabapentin to 200 mg twice a day.  Follow-up with me again after imaging to discuss further.  Lumbar  radiculopathy Patient is already having worsening lumbar radiculopathy.  Patient has been approved for the MRI but has not had it scheduled yet.  Having worsening symptoms and we will see how patient responds.                 Note: This dictation was prepared with Dragon dictation along with smaller phrase technology. Any transcriptional errors that result from this process are unintentional.

## 2022-01-07 ENCOUNTER — Encounter: Payer: Self-pay | Admitting: Family Medicine

## 2022-01-07 ENCOUNTER — Ambulatory Visit (INDEPENDENT_AMBULATORY_CARE_PROVIDER_SITE_OTHER): Payer: Managed Care, Other (non HMO)

## 2022-01-07 ENCOUNTER — Ambulatory Visit (INDEPENDENT_AMBULATORY_CARE_PROVIDER_SITE_OTHER): Payer: Managed Care, Other (non HMO) | Admitting: Family Medicine

## 2022-01-07 ENCOUNTER — Ambulatory Visit: Payer: Self-pay

## 2022-01-07 VITALS — Wt 228.0 lb

## 2022-01-07 DIAGNOSIS — M25511 Pain in right shoulder: Secondary | ICD-10-CM

## 2022-01-07 DIAGNOSIS — M25512 Pain in left shoulder: Secondary | ICD-10-CM | POA: Diagnosis not present

## 2022-01-07 DIAGNOSIS — M5416 Radiculopathy, lumbar region: Secondary | ICD-10-CM

## 2022-01-07 MED ORDER — GABAPENTIN 100 MG PO CAPS: 100.0000 mg | ORAL_CAPSULE | Freq: Two times a day (BID) | ORAL | 0 refills | Status: DC

## 2022-01-07 NOTE — Assessment & Plan Note (Signed)
Patient is already having worsening lumbar radiculopathy.  Patient has been approved for the MRI but has not had it scheduled yet.  Having worsening symptoms and we will see how patient responds.

## 2022-01-07 NOTE — Patient Instructions (Addendum)
Good to see you!  Get X-ray today.   We'll reach out to you once we have the MRI results.

## 2022-01-07 NOTE — Assessment & Plan Note (Signed)
Patient right shoulder pain is fairly significant overall.  This is affecting his daily activities, his work performance, as well as his nighttime.  I am concerned for an acute rotator cuff tear with the weakness noted as well.  Patient's ultrasound does show what appears to be likely a partial high-grade tear of the supraspinatus.  At this point because this is affecting all daily activities and even things such as dressing and sleep we do need to get the advanced imaging and depending on findings see if surgical intervention would be necessary or if patient would do better with conservative therapy.  We discussed different medications but he would like to continue with the over-the-counter anti-inflammatories but will increase his gabapentin to 200 mg twice a day.  Follow-up with me again after imaging to discuss further.

## 2022-01-21 ENCOUNTER — Ambulatory Visit
Admission: RE | Admit: 2022-01-21 | Discharge: 2022-01-21 | Disposition: A | Discharge status: Home / Self Care | Payer: Managed Care, Other (non HMO) | Source: Ambulatory Visit | Attending: Family Medicine | Admitting: Family Medicine

## 2022-01-21 DIAGNOSIS — M545 Low back pain, unspecified: Secondary | ICD-10-CM

## 2022-01-21 DIAGNOSIS — M25511 Pain in right shoulder: Secondary | ICD-10-CM

## 2022-01-29 ENCOUNTER — Other Ambulatory Visit: Payer: Self-pay

## 2022-01-29 DIAGNOSIS — M5416 Radiculopathy, lumbar region: Secondary | ICD-10-CM

## 2022-01-29 DIAGNOSIS — G8929 Other chronic pain: Secondary | ICD-10-CM

## 2022-02-27 ENCOUNTER — Ambulatory Visit
Admission: RE | Admit: 2022-02-27 | Discharge: 2022-02-27 | Disposition: A | Discharge status: Home / Self Care | Payer: Managed Care, Other (non HMO) | Source: Ambulatory Visit | Attending: Family Medicine | Admitting: Family Medicine

## 2022-02-27 DIAGNOSIS — M5416 Radiculopathy, lumbar region: Secondary | ICD-10-CM

## 2022-02-27 MED ORDER — IOPAMIDOL (ISOVUE-M 200) INJECTION 41%
1.0000 mL | Freq: Once | INTRAMUSCULAR | Status: AC
  Administered 2022-02-27: 1 mL via EPIDURAL

## 2022-02-27 MED ORDER — METHYLPREDNISOLONE ACETATE 40 MG/ML INJ SUSP (RADIOLOG
80.0000 mg | Freq: Once | INTRAMUSCULAR | Status: AC
  Administered 2022-02-27: 80 mg via EPIDURAL

## 2022-02-27 NOTE — Discharge Instructions (Signed)

## 2022-09-23 ENCOUNTER — Ambulatory Visit: Payer: Managed Care, Other (non HMO) | Admitting: Sports Medicine

## 2022-10-20 ENCOUNTER — Ambulatory Visit: Payer: Managed Care, Other (non HMO) | Admitting: Sports Medicine

## 2022-10-20 NOTE — Progress Notes (Unsigned)
  [name] [address] [place] [address] Phone: [phone] Subjective:    I'm seeing this patient by the request  of:  Daylene Katayama, [place]  CC:   [id number]  01/07/2022 Patient is already having worsening lumbar radiculopathy.  Patient has been approved for the MRI but has not had it scheduled yet.  Having worsening symptoms and we will see how patient responds.     Updated 10/21/2022 Arthur [name] is a 52 y.o. male coming in with complaint of R hip and lower back pain      No past medical history on file. No past surgical history on file. Social History   Socioeconomic History   Marital status: Married    Spouse name: Not on file   Number of children: Not on file   Years of education: Not on file   Highest education level: Not on file  Occupational History   Not on file  Tobacco Use   Smoking status: Not on file   Smokeless tobacco: Not on file  Substance and Sexual Activity   Alcohol use: Not on file   Drug use: Not on file   Sexual activity: Not on file  Other Topics Concern   Not on file  Social History Narrative   Not on file   Social Determinants of Health   Financial Resource Strain: Not on file  Food Insecurity: Not on file  Transportation Needs: Not on file  Physical Activity: Not on file  Stress: Not on file  Social Connections: Not on file   Not on File No family history on file.   Current Outpatient Medications (Cardiovascular):    amlodipine-atorvastatin (CADUET) 10-10 MG tablet, Take 1 tablet by mouth daily.   hydrochlorothiazide (MICROZIDE) 12.5 MG capsule, Take 12.5 mg by mouth daily.     Current Outpatient Medications (Other):    gabapentin (NEURONTIN) 100 MG capsule, Take 2 capsules (200 mg total) by mouth at bedtime.   gabapentin (NEURONTIN) 100 MG capsule, Take 1 capsule (100 mg total) by mouth 2 (two) times daily.   Vitamin D, Ergocalciferol, (DRISDOL) 1.25 MG (50000 UNIT) CAPS  capsule, Take 1 capsule (50,000 Units total) by mouth every 7 (seven) days.   Reviewed prior external information including notes and imaging from  primary care provider As well as notes that were available from care everywhere and other healthcare systems.  Past medical history, social, surgical and family history all reviewed in electronic medical record.  No pertanent information unless stated regarding to the chief complaint.   Review of Systems:  No headache, visual changes, nausea, vomiting, diarrhea, constipation, dizziness, abdominal pain, skin rash, fevers, chills, night sweats, weight loss, swollen lymph nodes, body aches, joint swelling, chest pain, shortness of breath, mood changes. POSITIVE muscle aches  Objective  There were no vitals taken for this visit.   General: No apparent distress alert and oriented x3 mood and affect normal, dressed appropriately.  HEENT: Pupils equal, extraocular movements intact  Respiratory: Patient's speak in full sentences and does not appear short of breath  Cardiovascular: No lower extremity edema, non tender, no erythema      Impression and Recommendations:

## 2022-10-21 ENCOUNTER — Ambulatory Visit (INDEPENDENT_AMBULATORY_CARE_PROVIDER_SITE_OTHER): Payer: Managed Care, Other (non HMO) | Admitting: Family Medicine

## 2022-10-21 ENCOUNTER — Encounter: Payer: Self-pay | Admitting: Family Medicine

## 2022-10-21 VITALS — BP 142/102 | HR 70 | Ht 69.0 in | Wt 239.0 lb

## 2022-10-21 DIAGNOSIS — M2550 Pain in unspecified joint: Secondary | ICD-10-CM | POA: Diagnosis not present

## 2022-10-21 DIAGNOSIS — M9904 Segmental and somatic dysfunction of sacral region: Secondary | ICD-10-CM | POA: Diagnosis not present

## 2022-10-21 DIAGNOSIS — M9901 Segmental and somatic dysfunction of cervical region: Secondary | ICD-10-CM | POA: Diagnosis not present

## 2022-10-21 DIAGNOSIS — M5416 Radiculopathy, lumbar region: Secondary | ICD-10-CM | POA: Diagnosis not present

## 2022-10-21 DIAGNOSIS — M9903 Segmental and somatic dysfunction of lumbar region: Secondary | ICD-10-CM | POA: Diagnosis not present

## 2022-10-21 DIAGNOSIS — M9902 Segmental and somatic dysfunction of thoracic region: Secondary | ICD-10-CM

## 2022-10-21 MED ORDER — METHYLPREDNISOLONE ACETATE 80 MG/ML IJ SUSP
80.0000 mg | Freq: Once | INTRAMUSCULAR | Status: AC
  Administered 2022-10-21: 80 mg via INTRAMUSCULAR

## 2022-10-21 MED ORDER — KETOROLAC TROMETHAMINE 60 MG/2ML IM SOLN
60.0000 mg | Freq: Once | INTRAMUSCULAR | Status: AC
  Administered 2022-10-21: 60 mg via INTRAMUSCULAR

## 2022-10-21 NOTE — Patient Instructions (Signed)
Injections in backside See me again in 5-6 weeks if you need manipulation

## 2022-10-21 NOTE — Assessment & Plan Note (Signed)
Family patient is having more of an exacerbation.  Toradol and Depo-Medrol given today, attempted osteopathic manipulation with some resolution of some discomfort as well.  Did discuss with patient that I do think he would respond well to an epidural again.  Patient wants to hold on that at the moment.  No change in medications, has been seeing a chiropractor greatly regularly.  Follow-up with me again in 6 to 8 weeks otherwise.

## 2022-11-25 NOTE — Progress Notes (Unsigned)
[facility] [address] [place] [address] Phone: [phone] Subjective:   [name], am serving as a scribe for Dr. Antoine Primas.  I'm seeing this patient by the request  of:  Gordnier, [place], [place]  CC: Back and neck pain follow-up  [id number]  Ovel Guba is a 52 y.o. male coming in with complaint of back and neck pain. OMT on 10/21/2022.  At last exam was having some radicular symptoms down the leg.  Was having an exacerbation and given Toradol and Depo-Medrol.  Patient states R side never lets up.  Medications patient has been prescribed:   Taking:     Last epidural of the back was in January 2024  MRI of the lumbar spine did show some spinal canal stenosis at L4-L5  Reviewed prior external information including notes and imaging from previsou exam, outside providers and external EMR if available.   As well as notes that were available from care everywhere and other healthcare systems.  Past medical history, social, surgical and family history all reviewed in electronic medical record.  No pertanent information unless stated regarding to the chief complaint.   No past medical history on file.  Not on File   Review of Systems:  No headache, visual changes, nausea, vomiting, diarrhea, constipation, dizziness, abdominal pain, skin rash, fevers, chills, night sweats, weight loss, swollen lymph nodes, body aches, joint swelling, chest pain, shortness of breath, mood changes. POSITIVE muscle aches  Objective  Blood pressure 126/74, pulse 87, height 5\' 9"  (1.753 m), weight 235 lb (106.6 kg), SpO2 99%.   General: No apparent distress alert and oriented x3 mood and affect normal, dressed appropriately.  HEENT: Pupils equal, extraocular movements intact  Respiratory: Patient's speak in full sentences and does not appear short of breath  Cardiovascular: No lower extremity edema, non tender, no erythema  Low back exam does have  some loss lordosis noted.  Some tenderness to palpation in the paraspinal musculature.  Patient does have some loss lordosis noted and poor core strength.  Worsening pain with extension of the back  Osteopathic findings  C2 flexed rotated and side bent right T3 extended rotated and side bent right inhaled rib T9 extended rotated and side bent left L2 flexed rotated and side bent right L4 flexed rotated and side bent left Sacrum right on right       Assessment and Plan:  Lumbar radiculopathy Chronic pain with worsening symptoms.  Do believe it is secondary to some of the spinal stenosis we have noted as well at L4-L5.  Will repeat the epidural to see if patient will get better response.  Has been 9 months since we have done 1.  Discussed icing regimen and home exercises, discussed core strengthening techniques.  Follow-up again in 6 to 8 weeks otherwise.  SI (sacroiliac) joint dysfunction Attempted osteopathic manipulation again.  Tolerated the procedure well.  Discussed icing regimen and home exercises.    Nonallopathic problems  Decision today to treat with OMT was based on Physical Exam  After verbal consent patient was treated with HVLA, ME, FPR techniques in cervical, rib, thoracic, lumbar, and sacral  areas  Patient tolerated the procedure well with improvement in symptoms  Patient given exercises, stretches and lifestyle modifications  See medications in patient instructions if given  Patient will follow up in 4-8 weeks    The above documentation has been reviewed and is accurate and complete Judi Saa, DO  Note: This dictation was prepared with Dragon dictation along with smaller phrase technology. Any transcriptional errors that result from this process are unintentional.

## 2022-11-26 ENCOUNTER — Encounter: Payer: Self-pay | Admitting: Family Medicine

## 2022-11-26 ENCOUNTER — Ambulatory Visit (INDEPENDENT_AMBULATORY_CARE_PROVIDER_SITE_OTHER): Payer: Managed Care, Other (non HMO) | Admitting: Family Medicine

## 2022-11-26 VITALS — BP 126/74 | HR 87 | Ht 69.0 in | Wt 235.0 lb

## 2022-11-26 DIAGNOSIS — M9903 Segmental and somatic dysfunction of lumbar region: Secondary | ICD-10-CM

## 2022-11-26 DIAGNOSIS — M533 Sacrococcygeal disorders, not elsewhere classified: Secondary | ICD-10-CM

## 2022-11-26 DIAGNOSIS — M9902 Segmental and somatic dysfunction of thoracic region: Secondary | ICD-10-CM

## 2022-11-26 DIAGNOSIS — M9901 Segmental and somatic dysfunction of cervical region: Secondary | ICD-10-CM

## 2022-11-26 DIAGNOSIS — M9908 Segmental and somatic dysfunction of rib cage: Secondary | ICD-10-CM

## 2022-11-26 DIAGNOSIS — M5416 Radiculopathy, lumbar region: Secondary | ICD-10-CM | POA: Diagnosis not present

## 2022-11-26 DIAGNOSIS — M9904 Segmental and somatic dysfunction of sacral region: Secondary | ICD-10-CM | POA: Diagnosis not present

## 2022-11-26 NOTE — Assessment & Plan Note (Signed)
Attempted osteopathic manipulation again.  Tolerated the procedure well.  Discussed icing regimen and home exercises.

## 2022-11-26 NOTE — Patient Instructions (Addendum)
[facility] [phone] Call Today  Have fun at the races See you again 6 weeks after epidural

## 2022-11-26 NOTE — Assessment & Plan Note (Signed)
Chronic pain with worsening symptoms.  Do believe it is secondary to some of the spinal stenosis we have noted as well at L4-L5.  Will repeat the epidural to see if patient will get better response.  Has been 9 months since we have done 1.  Discussed icing regimen and home exercises, discussed core strengthening techniques.  Follow-up again in 6 to 8 weeks otherwise.

## 2022-11-27 ENCOUNTER — Encounter: Payer: Self-pay | Admitting: Family Medicine

## 2022-12-02 NOTE — Discharge Instructions (Signed)

## 2022-12-03 ENCOUNTER — Ambulatory Visit
Admission: RE | Admit: 2022-12-03 | Discharge: 2022-12-03 | Disposition: A | Discharge status: Home / Self Care | Payer: Managed Care, Other (non HMO) | Source: Ambulatory Visit | Attending: Family Medicine | Admitting: Family Medicine

## 2022-12-03 DIAGNOSIS — M5416 Radiculopathy, lumbar region: Secondary | ICD-10-CM

## 2022-12-03 MED ORDER — IOPAMIDOL (ISOVUE-M 200) INJECTION 41%
1.0000 mL | Freq: Once | INTRAMUSCULAR | Status: AC
  Administered 2022-12-03: 1 mL via EPIDURAL

## 2022-12-03 MED ORDER — METHYLPREDNISOLONE ACETATE 40 MG/ML INJ SUSP (RADIOLOG
80.0000 mg | Freq: Once | INTRAMUSCULAR | Status: AC
  Administered 2022-12-03: 80 mg via EPIDURAL

## 2022-12-18 ENCOUNTER — Encounter: Payer: Self-pay | Admitting: Family Medicine

## 2022-12-18 ENCOUNTER — Other Ambulatory Visit: Payer: Self-pay

## 2022-12-18 DIAGNOSIS — M5416 Radiculopathy, lumbar region: Secondary | ICD-10-CM

## 2022-12-23 ENCOUNTER — Encounter: Payer: Self-pay | Admitting: Family Medicine

## 2022-12-24 ENCOUNTER — Ambulatory Visit
Admission: RE | Admit: 2022-12-24 | Discharge: 2022-12-24 | Disposition: A | Discharge status: Home / Self Care | Payer: Managed Care, Other (non HMO) | Source: Ambulatory Visit | Attending: Family Medicine | Admitting: Family Medicine

## 2022-12-24 DIAGNOSIS — M5416 Radiculopathy, lumbar region: Secondary | ICD-10-CM

## 2022-12-24 MED ORDER — METHYLPREDNISOLONE ACETATE 40 MG/ML INJ SUSP (RADIOLOG
80.0000 mg | Freq: Once | INTRAMUSCULAR | Status: AC
  Administered 2022-12-24: 80 mg via EPIDURAL

## 2022-12-24 MED ORDER — IOPAMIDOL (ISOVUE-M 200) INJECTION 41%
1.0000 mL | Freq: Once | INTRAMUSCULAR | Status: AC
  Administered 2022-12-24: 1 mL via EPIDURAL

## 2022-12-24 NOTE — Discharge Instructions (Signed)

## 2024-01-12 IMAGING — DX DG LUMBAR SPINE 2-3V
3 series · 3 of 3 positions shown · non-contrast
Comparison: None.

CLINICAL DATA: Chronic low back pain, no known injury, initial
encounter

EXAM:
LUMBAR SPINE - 3 VIEW

[l-spine ap]
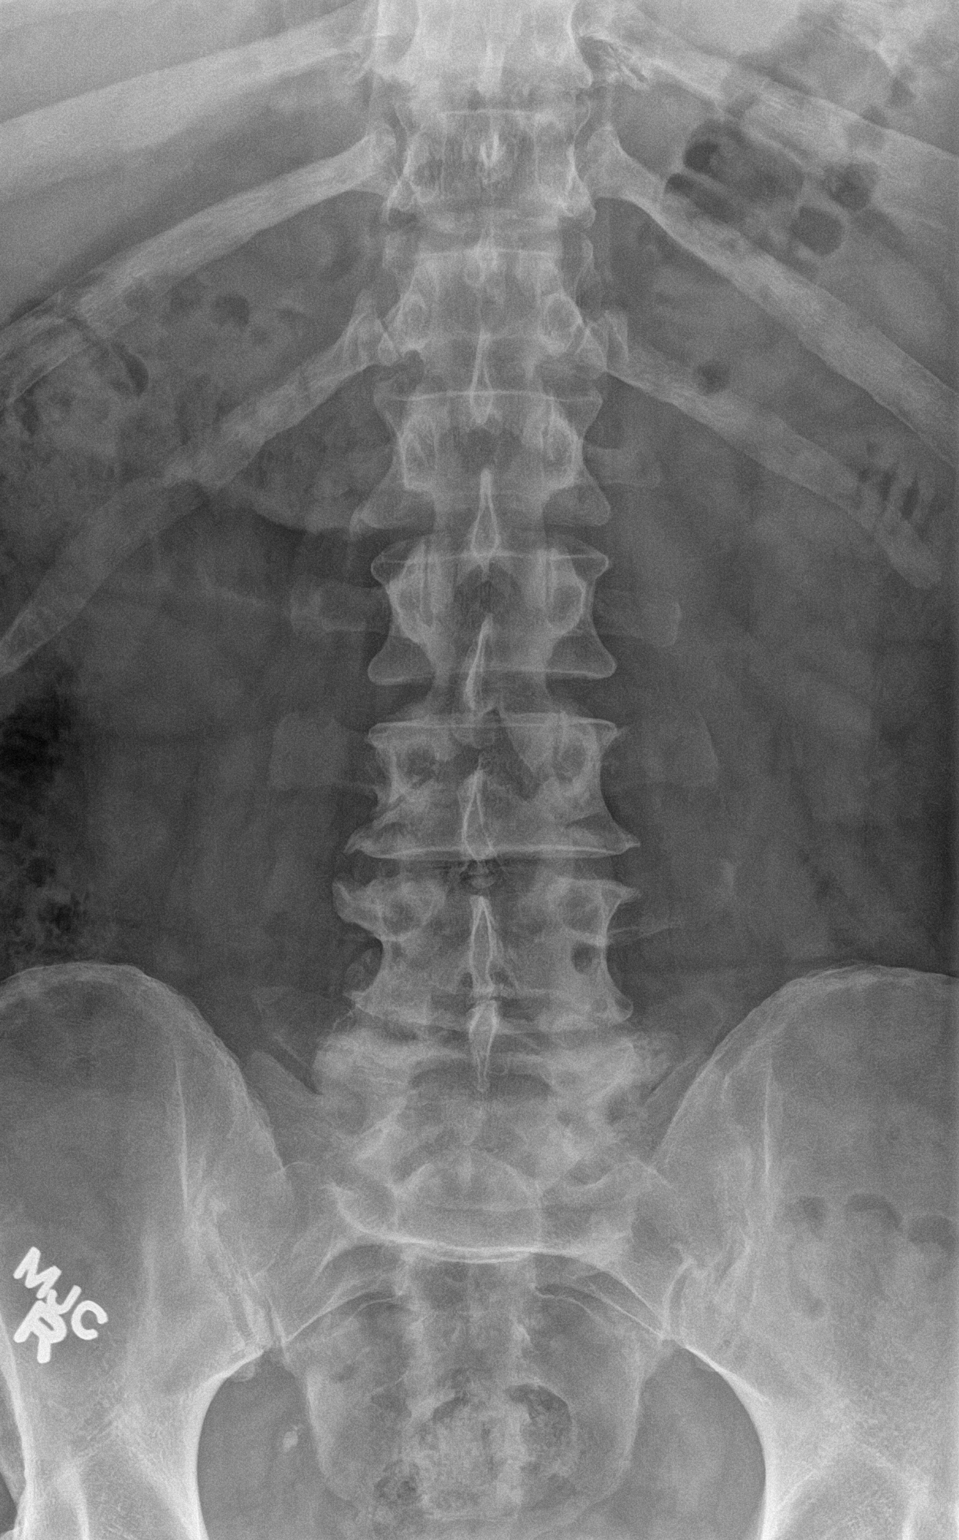

[l-spine lateral]
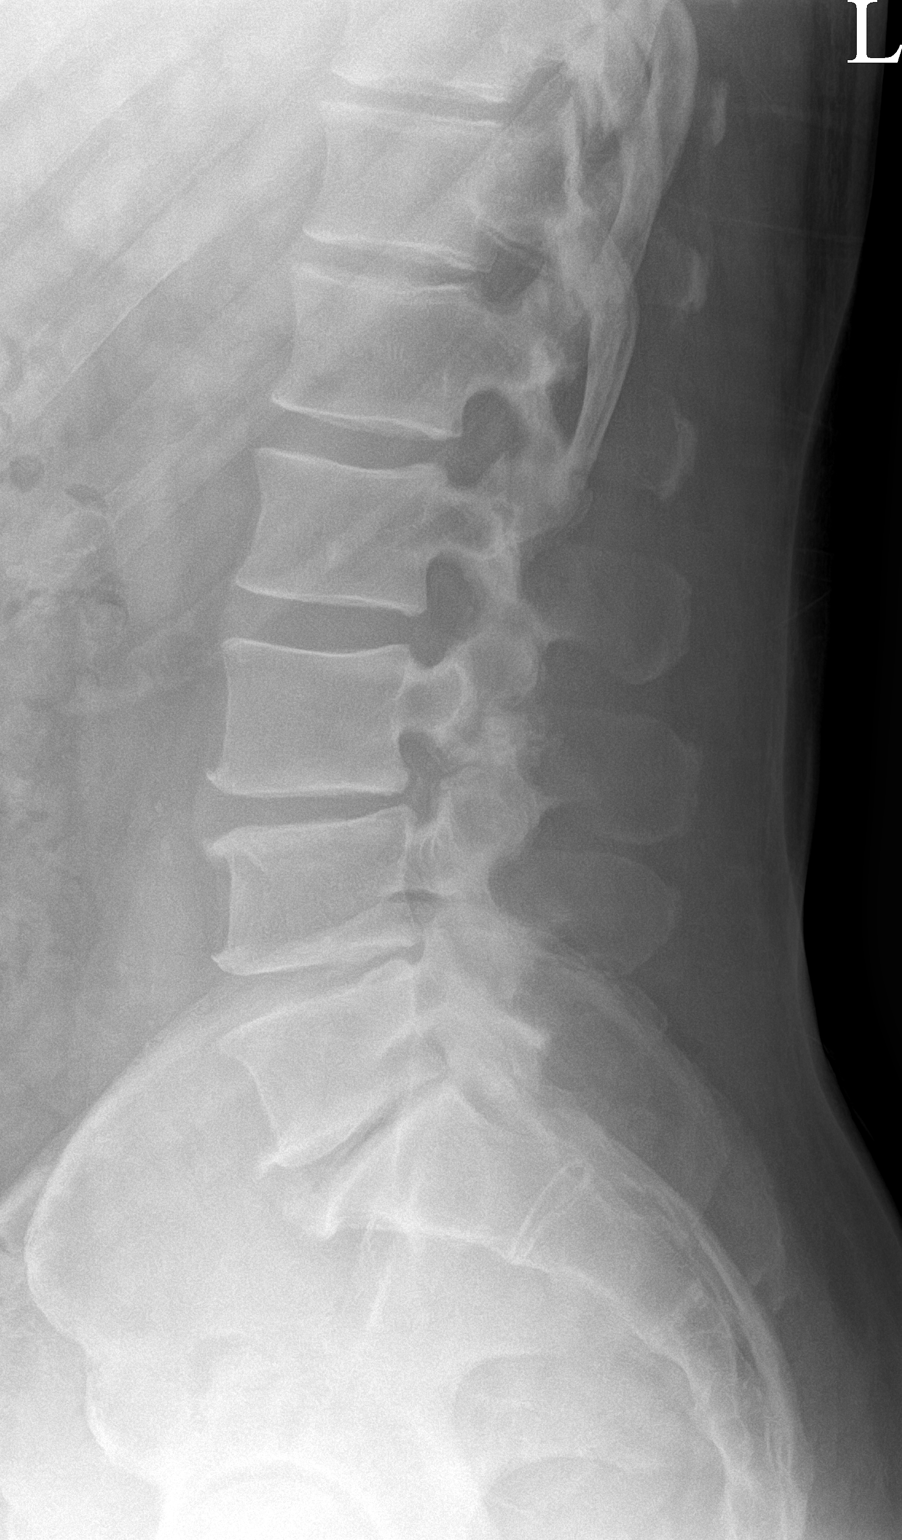

[l-spine spot]
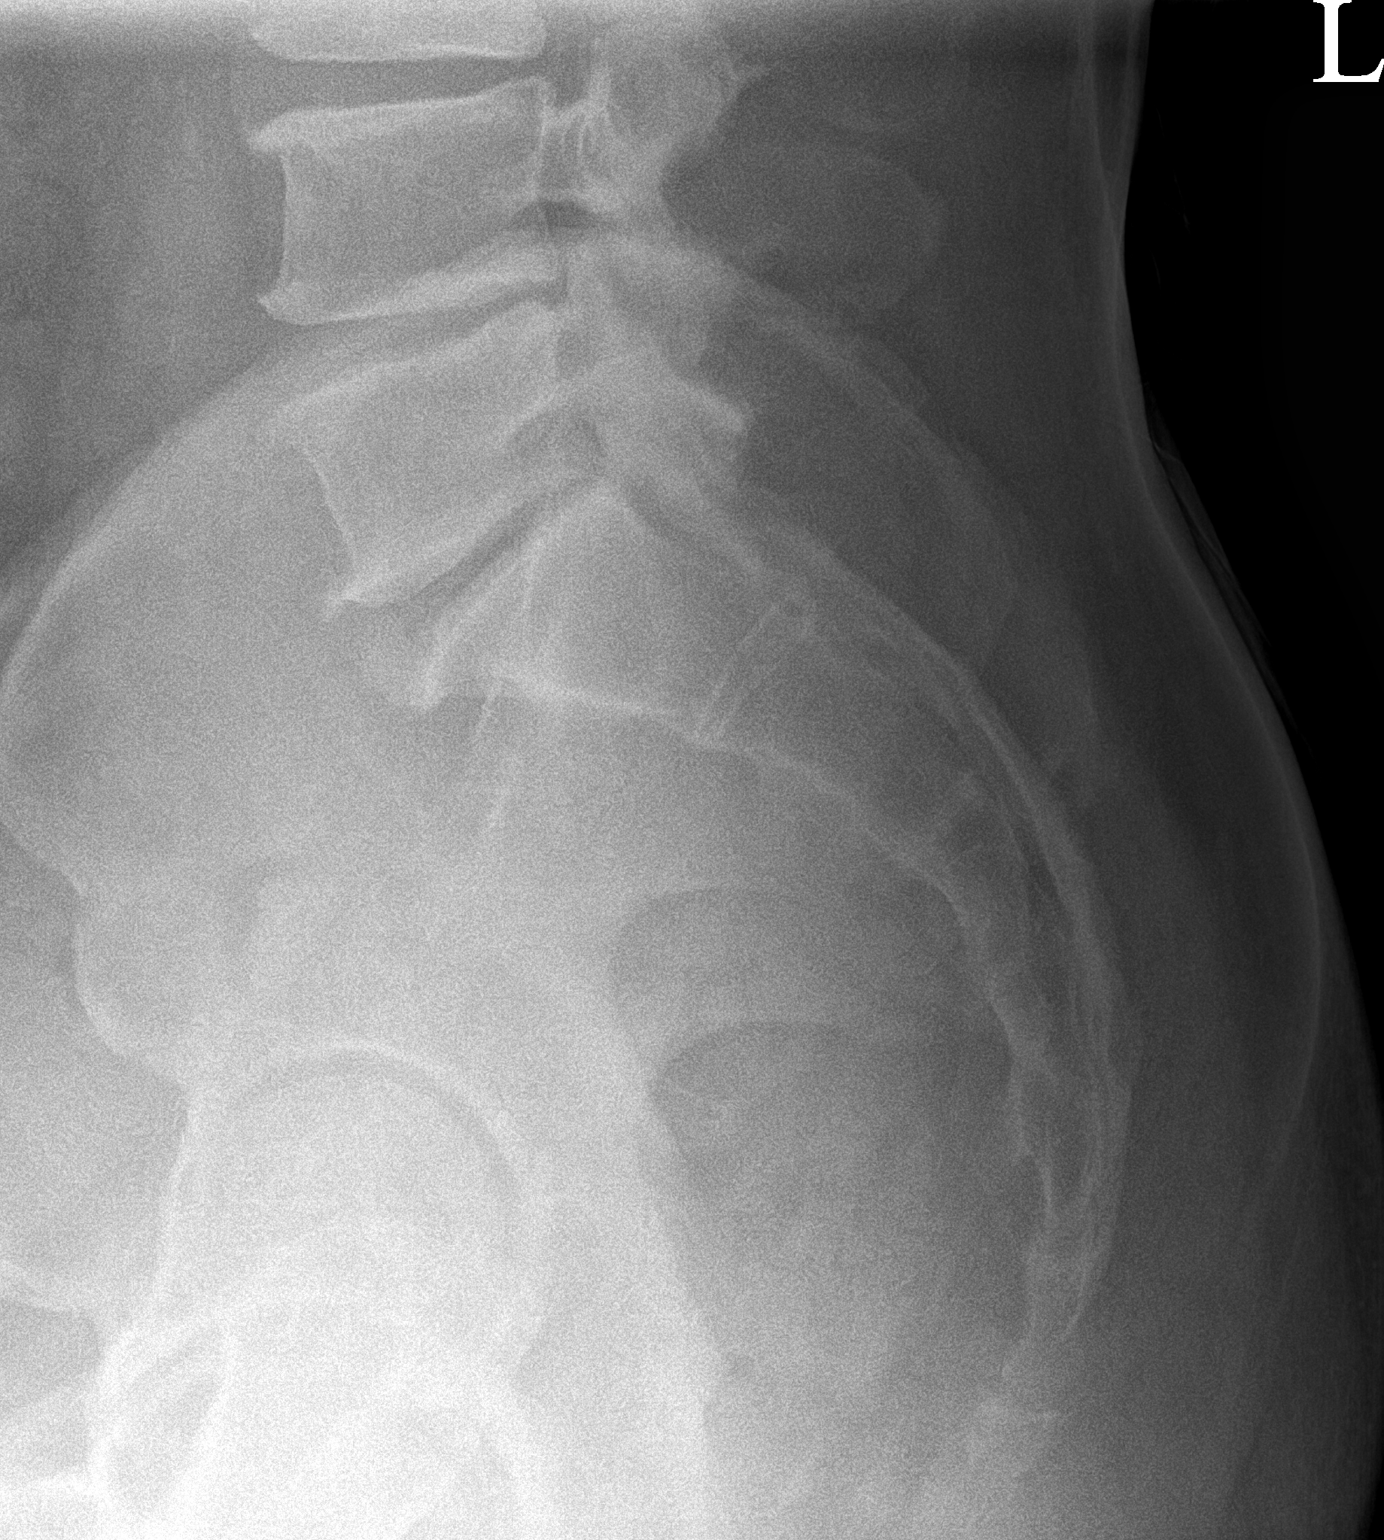

[3 of 3 positions shown; findings below may reference images not displayed]

FINDINGS: Five non rib-bearing lumbar vertebra are noted. Mild osteophytic
changes are seen. Disc space narrowing is noted from L3-S1. No
compression deformity is noted. Mild degenerative retrolisthesis of
L4 on L5 is seen.
IMPRESSION: Degenerative change as described.

## 2024-01-12 IMAGING — DX DG CERVICAL SPINE 2 OR 3 VIEWS
4 series · 4 of 4 positions shown · non-contrast
Comparison: None.

CLINICAL DATA: Neck pain, no known injury, initial encounter

EXAM:
CERVICAL SPINE - 3 VIEW

[c-spine lat]
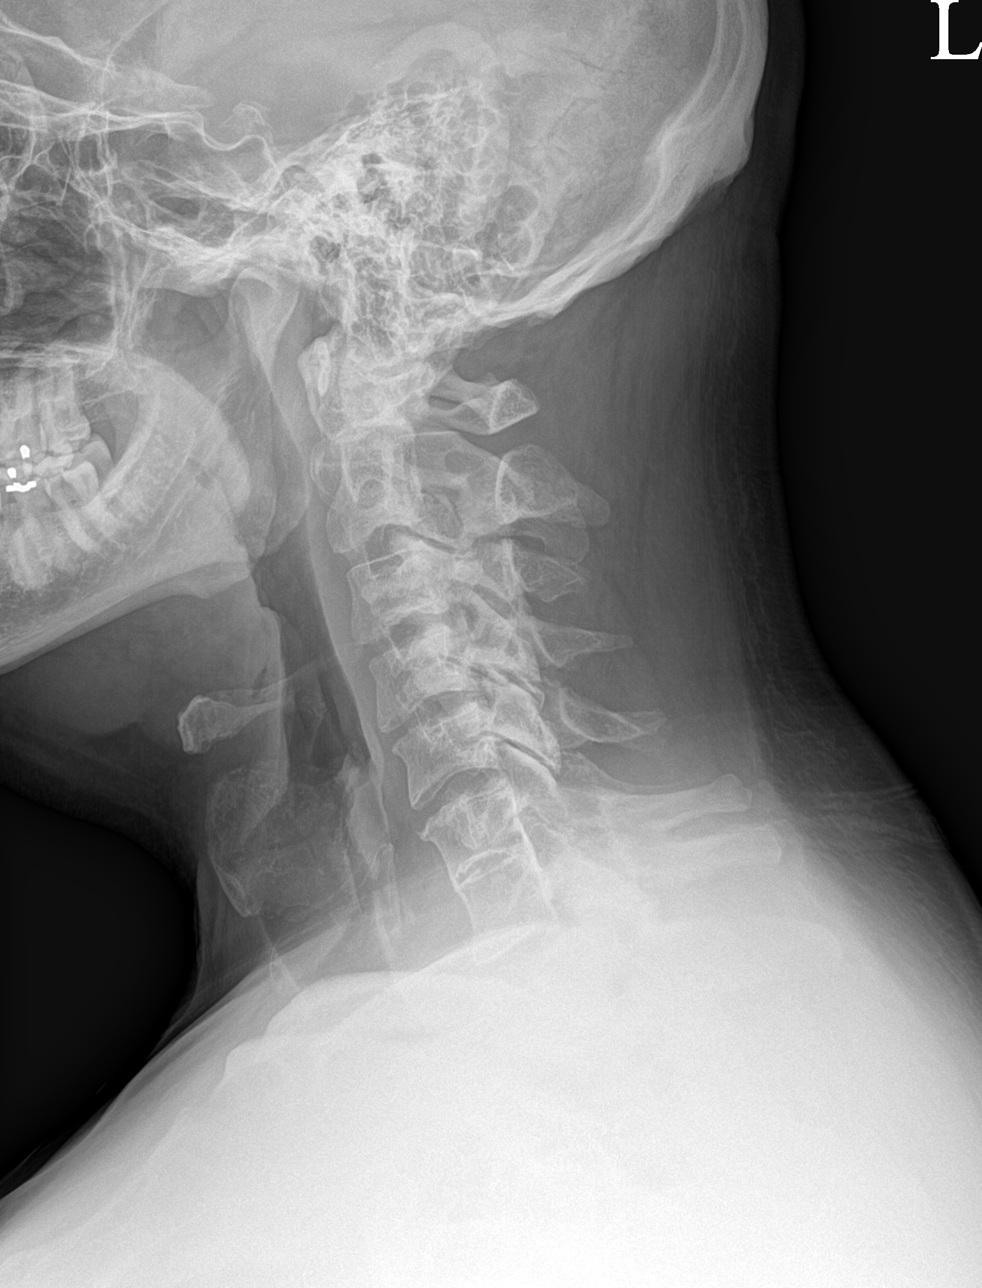

[c-spine ap]
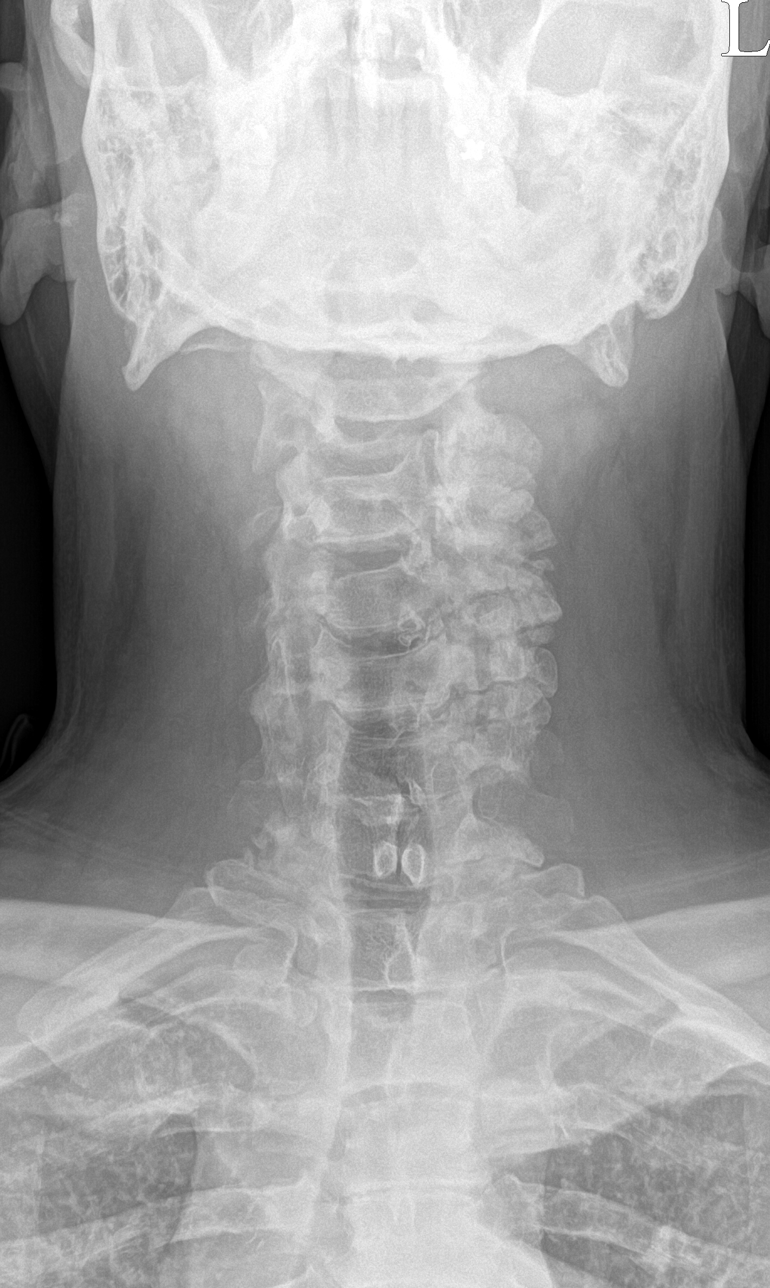

[c-spine open mouth]
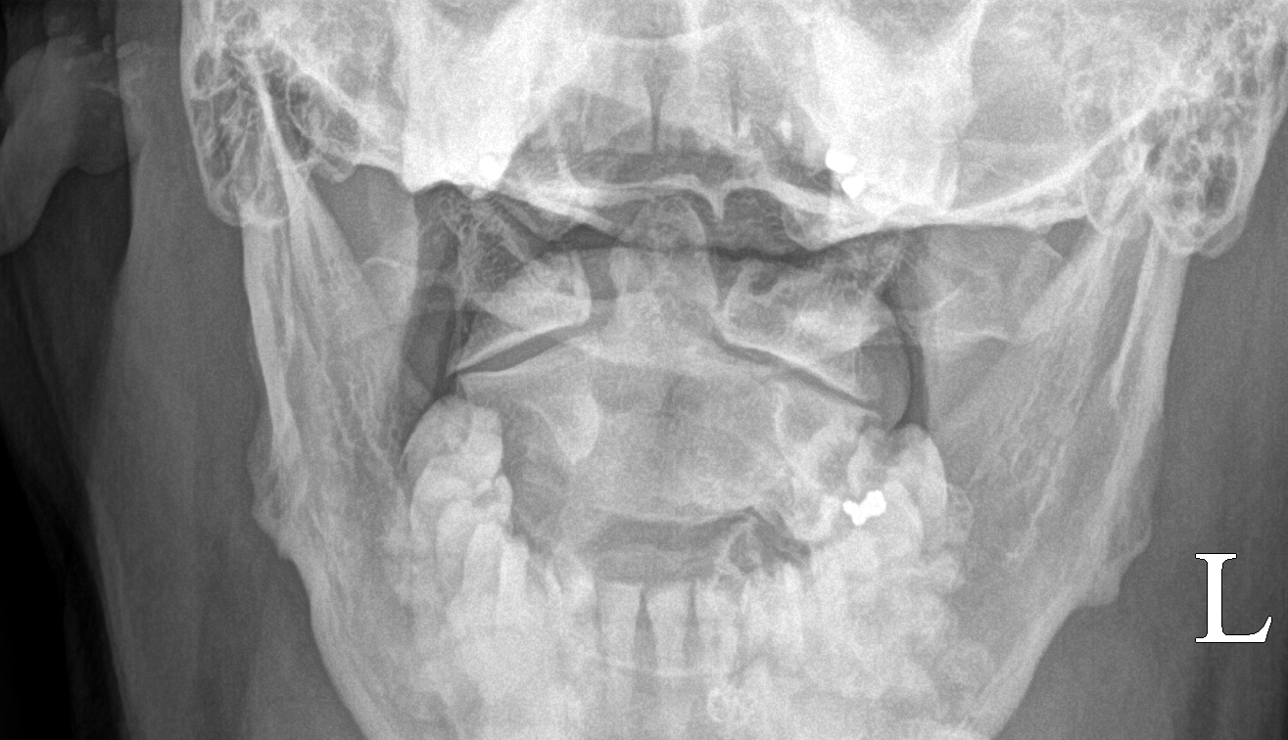

[ct-spine swimmers]
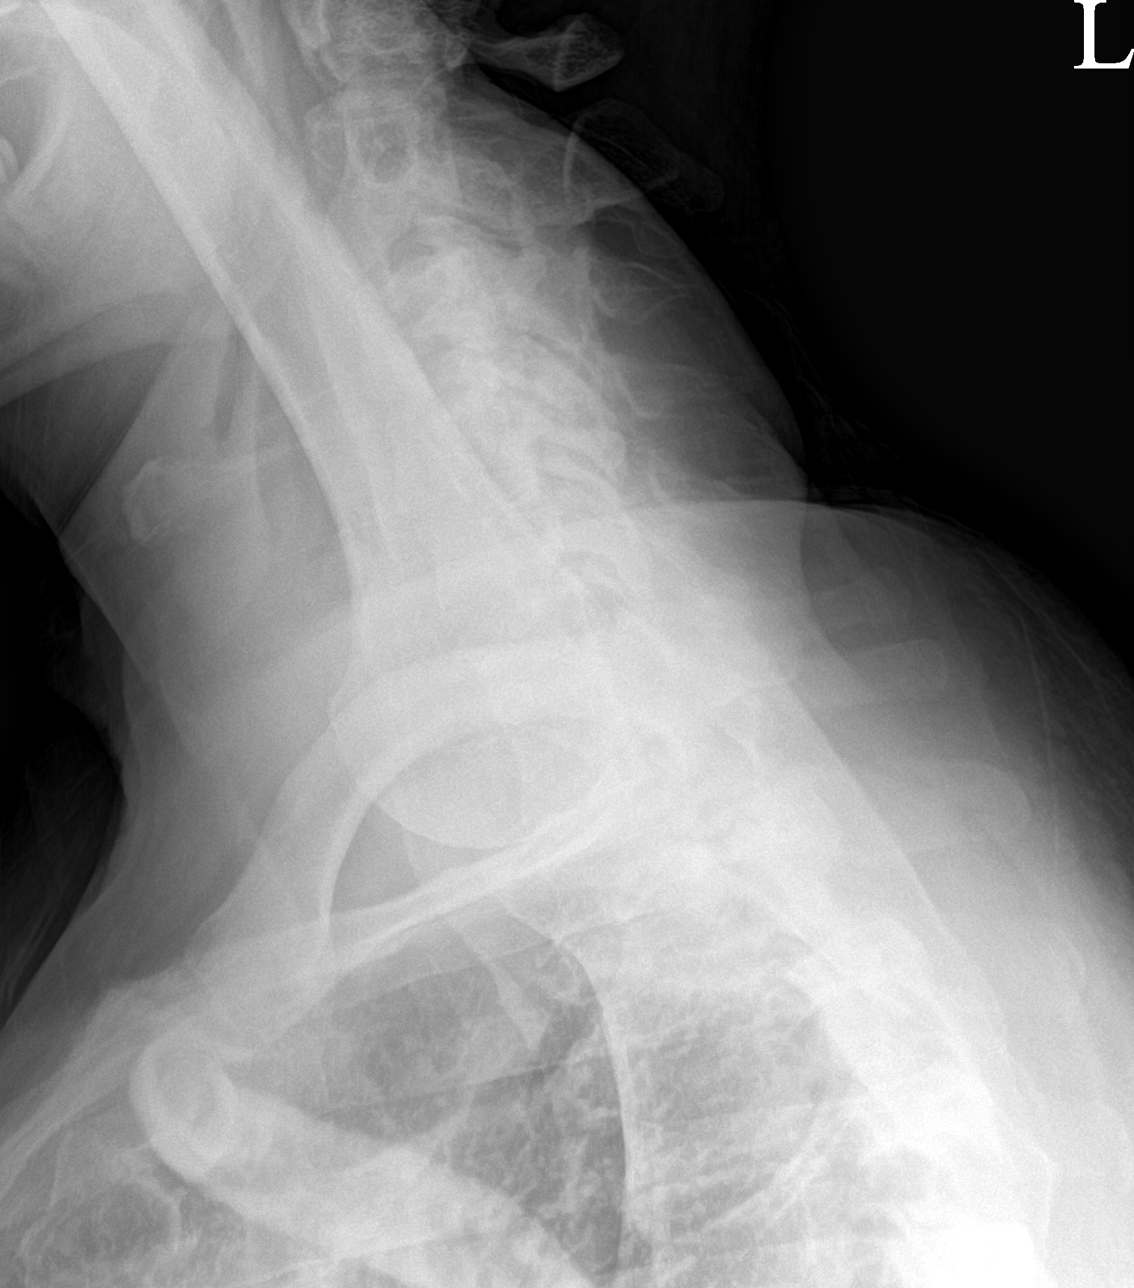

[4 of 4 positions shown; findings below may reference images not displayed]

FINDINGS: Seven cervical segments are well visualized. There are changes
consistent with fusion at C6-7 which appears congenital in nature.
Mild osteophytic changes are noted at C5-6. Facet hypertrophic
changes are noted primarily on the left. The odontoid is within
normal limits. No soft tissue abnormality is seen.
IMPRESSION: Degenerative changes as described.

No acute abnormality noted.
